# Patient Record
Sex: Female | Born: 1990
Health system: Western US, Academic
[De-identification: ages and names within clinical notes are randomized; demographics above are authoritative.]

---
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
## 2010-04-10 LAB — URINE CULTURE
Colony Count: NO GROWTH
Culture: NO GROWTH

## 2010-04-10 LAB — URINALYSIS, ROUTINE W REFLEX MICROSCOPIC
Glucose, UA: NEGATIVE mg/dL
Protein, ur: NEGATIVE mg/dL
Urobilinogen, UA: 0.2 mg/dL (ref 0.0–1.0)

## 2011-09-08 IMAGING — CR DG CHEST 2V
2 series · 2 of 2 positions shown · non-contrast
Comparison: None.

CLINICAL DATA: Fever and chills.

CHEST - 2 VIEW

[w chest pa]
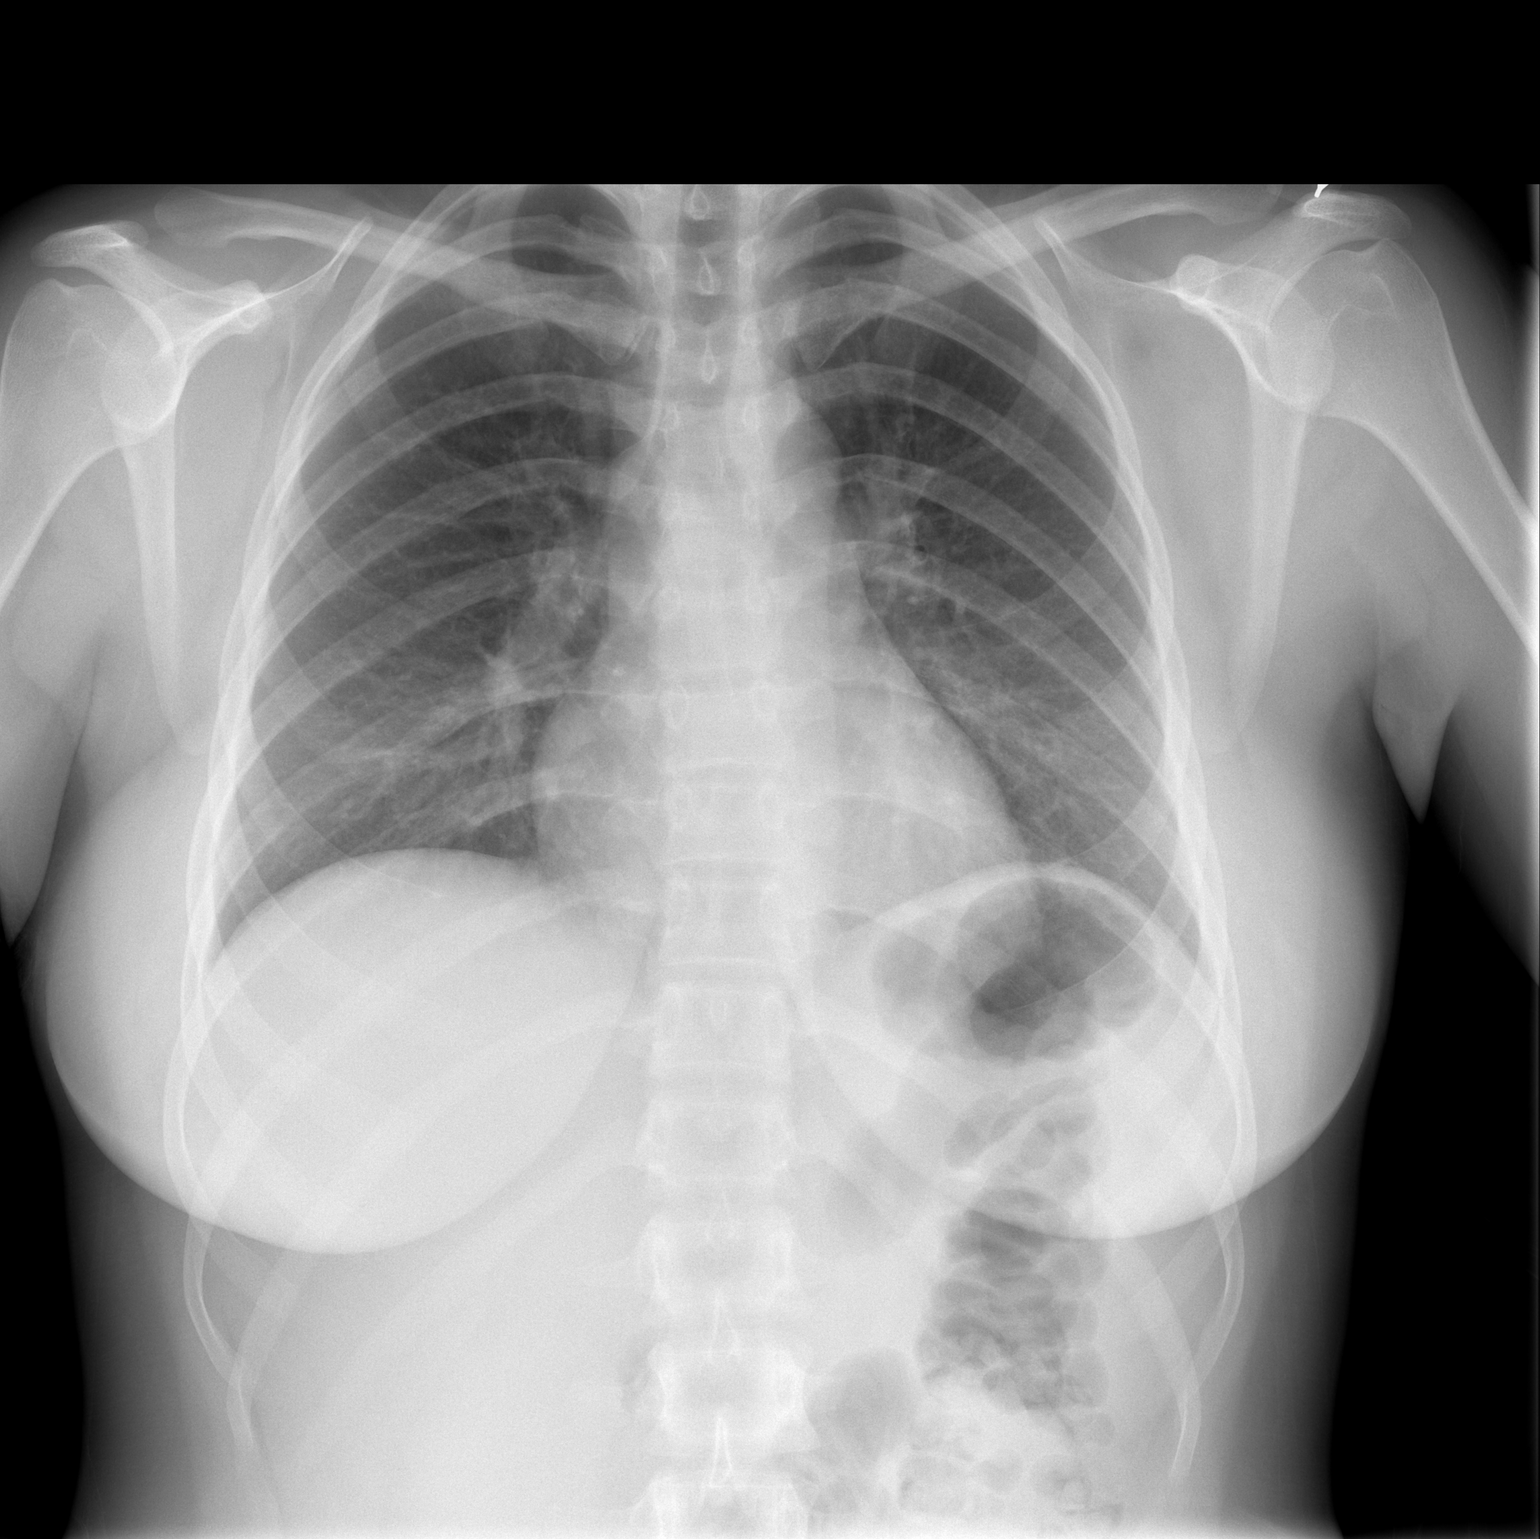

[w chest lat]
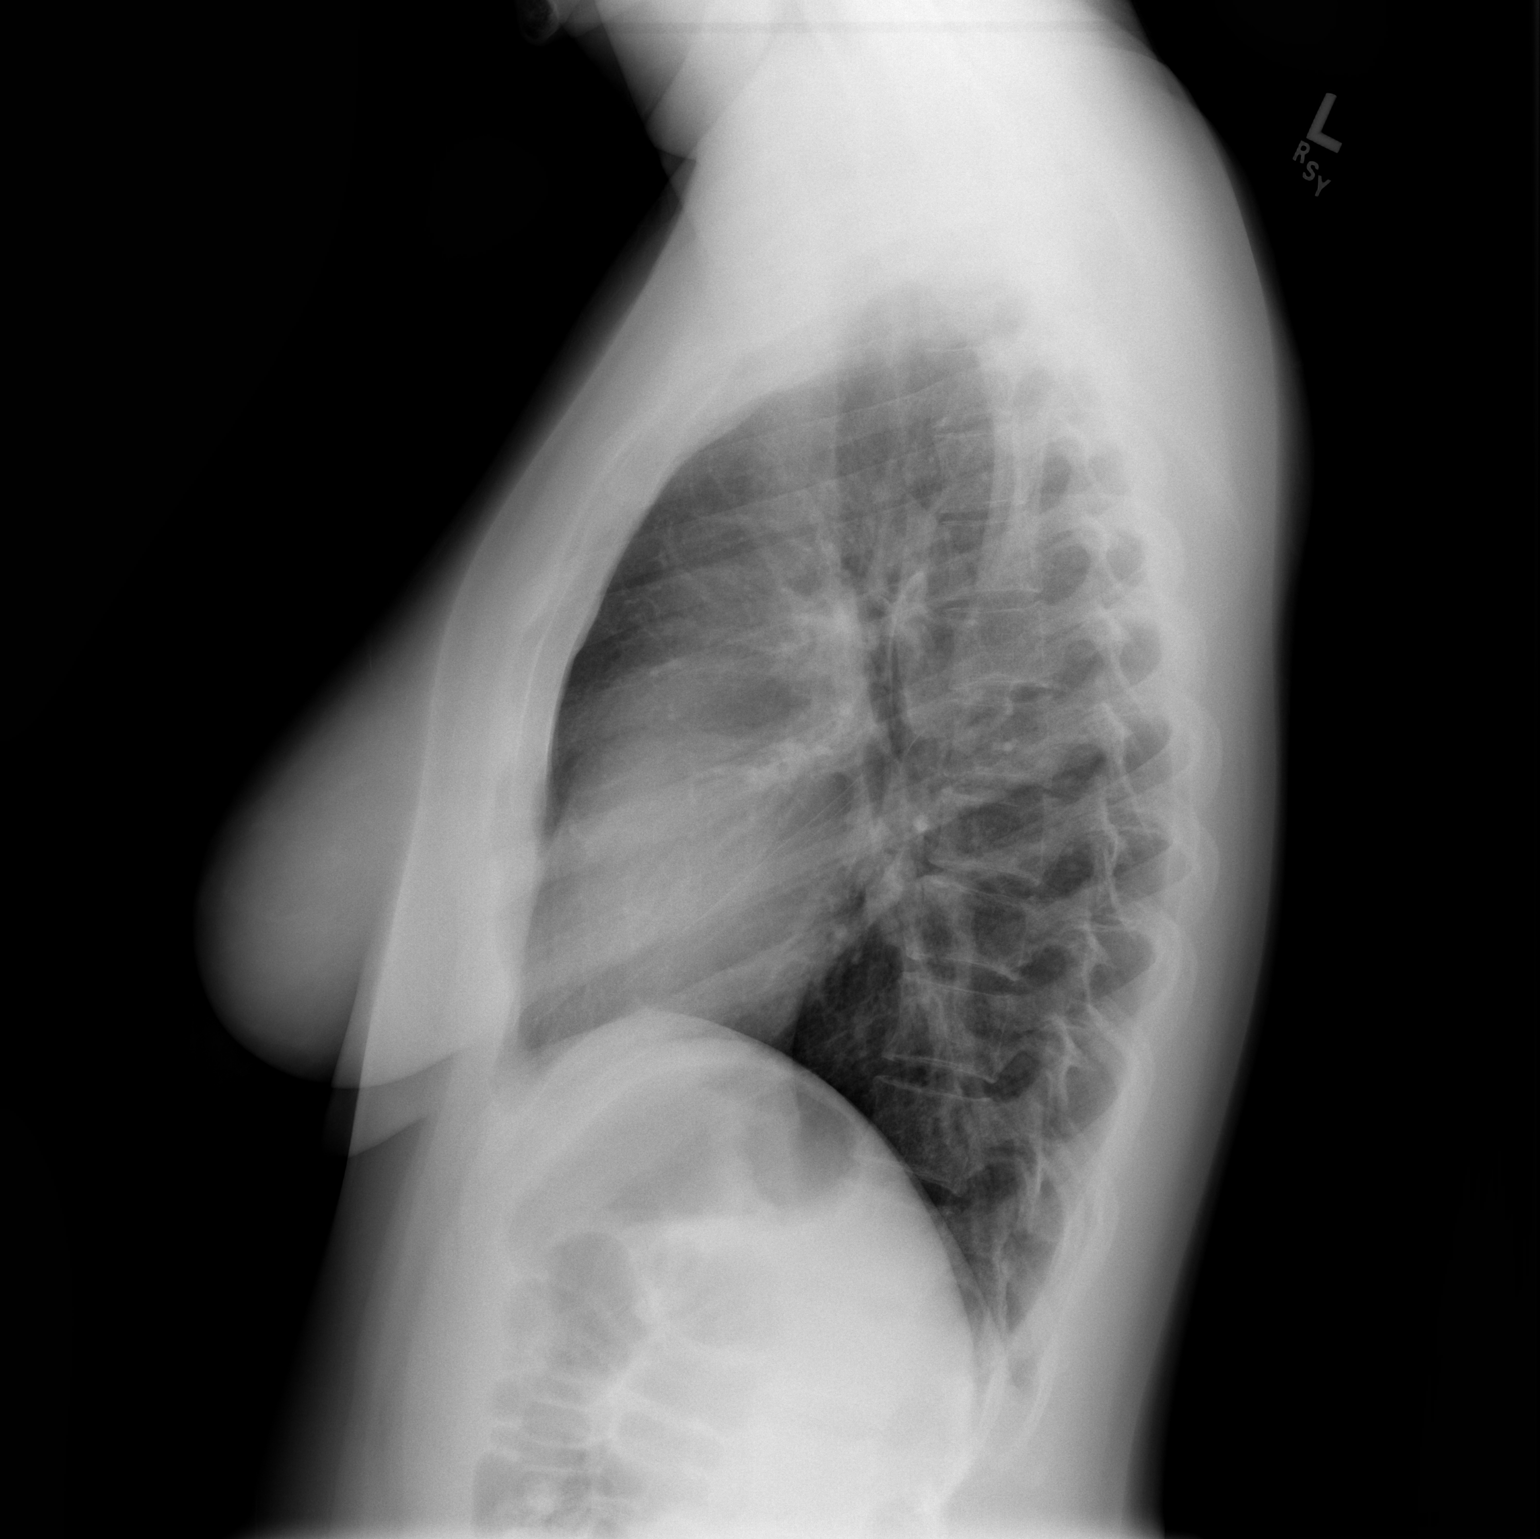

[2 of 2 positions shown; findings below may reference images not displayed]

FINDINGS: The lungs are clear.  No pleural effusion.  Heart size
normal.
IMPRESSION: Negative exam.

## 2017-08-20 ENCOUNTER — Ambulatory Visit

## 2017-08-21 ENCOUNTER — Ambulatory Visit

## 2017-08-21 DIAGNOSIS — Z01419 Encounter for gynecological examination (general) (routine) without abnormal findings: Secondary | ICD-10-CM

## 2017-08-21 NOTE — Progress Notes
[name]   OBSTETRICS, GYNECOLOGY & UROGYNECOLOGY     PATIENT: Erica Wilkins  MRN: [id number]  DOB: 10/14/1990  DATE OF SERVICE: 08/21/2017  PCP: Aviva Kluver, MD     Chief Complaint:   Chief Complaint   Patient presents with   ??? Annual Exam       Subjective:  Erica Wilkins is a 27 y.o. female [id number] due for annual well woman exam.    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 Losing insurance at end of this month.   No complaints.   Mirena IUD x 2 years. Some cramping, no bleeding. Overall happy.     Last Pap: 07/2015- normal. No hx of abnormal pap smears.     Past Ob/Gyn Hx  OB History     Gravida Para Term Preterm AB Living    0 0 0 0 0 0    SAB TAB Ectopic Multiple Live Births    0 0 0 0          Obstetric Comments    No hx of abnormal pap   No hx of STI/ HSV  Mirena IUD 2017          See above for gyn history    Past medical/surgical/family history updated.    Past Medical Hx  Past Medical History:   Diagnosis Date   ??? Patient denies medical problems        Past Surgical Hx  Past Surgical History:   Procedure Laterality Date   ??? right arm surgery   1998       Past Family Hx  Family History   Problem Relation Age of Onset   ??? Lymphoma Father    ??? Thyroid disease Sister    ??? Thyroid disease Sister    ??? Lung cancer Maternal Grandfather         smoker   ??? Lung cancer Paternal Grandfather         smoker   ??? Diabetes Paternal Grandfather    ??? Diabetes Paternal Grandmother    ??? Breast cancer Neg Hx    ??? Ovarian cancer Neg Hx    ??? Uterine cancer Neg Hx    ??? Colon cancer Neg Hx        Social Hx:  Social History     Social History   ??? Marital status: Single     Spouse name: N/A   ??? Number of children: N/A   ??? Years of education: N/A     Social History Main Topics   ??? Smoking status: Never Smoker   ??? Smokeless tobacco: Never Used   ??? Alcohol use Yes   ??? Drug use: No   ??? Sexual activity: Yes     Partners: Male Birth control/ protection: Female Condom     Other Topics Concern   ??? 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        Objective:  Vitals:    08/21/17 1349   BP: 106/66   Pulse: 69   Temp: 37.1 ???C (98.7 ???F)   TempSrc: Oral   Weight: 188 lb (85.3 kg)   Height: 5' 5'' (1.651 m)     Gen: No acute distress, well appearing  Breasts: symmetric bilaterally, no cervical or axillary LAD. No  nipple discharge. No skin changes. Soft, no discrete masses palpated. Fibrocystic changes diffusely noted.  Abd: Soft nontender, nondistended  Pelvic:  URETHRA: normal appearing urethra with no masses, tenderness or lesions  CLITORIS: normal appearing clitoris with no hypertrophy or enlargement  BARTHOLIN'S GLANDS: normal appearing Bartholin's glands with no masses, tenderness or erythema  VULVA: normal appearing vulva with no masses, tenderness or lesions  VAGINA: normal appearing vagina, estrogenized, no masses or no lesions  CERVIX: Normal appearing cervix without discharge or lesions. Non-tender. String visualized  UTERUS: uterus is normal size, shape, consistency and nontender  ADNEXA: No adnexal masses or tenderness to palpation  ANUS: no external hemorrhoids, fissures, or masses    Assessment and Plan:  1. Encounter for gynecological examination without abnormal finding    Normal breast and pelvic exam.   S/p gardisil vaccination series  Pap - not due until 07/2018  Mammogram -n/a  Mirena IUD 2017- reviewed FDA approved for 5 years, effective up to 7 years.   Counseling:  - Discussed breast self awareness, clinical breast exams, and screening mammograms starting at 27 years old every 1-2 years.   - Pap screening recommendations (low risk pts) reviewed: Pap Q 3 yrs in women age 30-29, Pap with HPV Q 5 yrs in women age 50-65, discontinue pap screening at age 72 - Calcium 1000-1300mg  /Vit D 600-800IU daily/weight bearing exercises recommended for bone health.  - Reviewed exercise and cardiovascular health recommendations  - RTC for annual WWE, sooner PRN      Electronically signed by:  Larey Seat, MD  [facility]

## 2019-02-11 ENCOUNTER — Ambulatory Visit: Payer: 59 | Admitting: Advanced Practice Midwife

## 2019-02-11 ENCOUNTER — Encounter: Payer: Self-pay | Admitting: Advanced Practice Midwife

## 2019-02-11 ENCOUNTER — Other Ambulatory Visit: Payer: Self-pay

## 2019-02-11 VITALS — BP 125/72 | HR 71 | Ht 65.0 in | Wt 195.7 lb

## 2019-02-11 DIAGNOSIS — Z01419 Encounter for gynecological examination (general) (routine) without abnormal findings: Secondary | ICD-10-CM | POA: Diagnosis not present

## 2019-02-11 DIAGNOSIS — Z8349 Family history of other endocrine, nutritional and metabolic diseases: Secondary | ICD-10-CM

## 2019-02-11 DIAGNOSIS — Z124 Encounter for screening for malignant neoplasm of cervix: Secondary | ICD-10-CM

## 2019-02-11 NOTE — Patient Instructions (Signed)
Exercising to Stay Healthy To become healthy and stay healthy, it is recommended that you do moderate-intensity and vigorous-intensity exercise. You can tell that you are exercising at a moderate intensity if your heart starts beating faster and you start breathing faster but can still hold a conversation. You can tell that you are exercising at a vigorous intensity if you are breathing much harder and faster and cannot hold a conversation while exercising. Exercising regularly is important. It has many health benefits, such as:  Improving overall fitness, flexibility, and endurance.  Increasing bone density.  Helping with weight control.  Decreasing body fat.  Increasing muscle strength.  Reducing stress and tension.  Improving overall health. How often should I exercise? Choose an activity that you enjoy, and set realistic goals. Your health care provider can help you make an activity plan that works for you. Exercise regularly as told by your health care provider. This may include:  Doing strength training two times a week, such as: ? Lifting weights. ? Using resistance bands. ? Push-ups. ? Sit-ups. ? Yoga.  Doing a certain intensity of exercise for a given amount of time. Choose from these options: ? A total of 150 minutes of moderate-intensity exercise every week. ? A total of 75 minutes of vigorous-intensity exercise every week. ? A mix of moderate-intensity and vigorous-intensity exercise every week. Children, pregnant women, people who have not exercised regularly, people who are overweight, and older adults may need to talk with a health care provider about what activities are safe to do. If you have a medical condition, be sure to talk with your health care provider before you start a new exercise program. What are some exercise ideas? Moderate-intensity exercise ideas include:  Walking 1 mile (1.6 km) in about 15  minutes.  Biking.  Hiking.  Golfing.  Dancing.  Water aerobics. Vigorous-intensity exercise ideas include:  Walking 4.5 miles (7.2 km) or more in about 1 hour.  Jogging or running 5 miles (8 km) in about 1 hour.  Biking 10 miles (16.1 km) or more in about 1 hour.  Lap swimming.  Roller-skating or in-line skating.  Cross-country skiing.  Vigorous competitive sports, such as football, basketball, and soccer.  Jumping rope.  Aerobic dancing. What are some everyday activities that can help me to get exercise?  Yard work, such as: ? Pushing a lawn mower. ? Raking and bagging leaves.  Washing your car.  Pushing a stroller.  Shoveling snow.  Gardening.  Washing windows or floors. How can I be more active in my day-to-day activities?  Use stairs instead of an elevator.  Take a walk during your lunch break.  If you drive, park your car farther away from your work or school.  If you take public transportation, get off one stop early and walk the rest of the way.  Stand up or walk around during all of your indoor phone calls.  Get up, stretch, and walk around every 30 minutes throughout the day.  Enjoy exercise with a friend. Support to continue exercising will help you keep a regular routine of activity. What guidelines can I follow while exercising?  Before you start a new exercise program, talk with your health care provider.  Do not exercise so much that you hurt yourself, feel dizzy, or get very short of breath.  Wear comfortable clothes and wear shoes with good support.  Drink plenty of water while you exercise to prevent dehydration or heat stroke.  Work out until your breathing   and your heartbeat get faster. Where to find more information  [place] Department of Health and Human Services: www.hhs.gov  Centers for Disease Control and Prevention (CDC): www.cdc.gov Summary  Exercising regularly is important. It will improve your overall fitness,  flexibility, and endurance.  Regular exercise also will improve your overall health. It can help you control your weight, reduce stress, and improve your bone density.  Do not exercise so much that you hurt yourself, feel dizzy, or get very short of breath.  Before you start a new exercise program, talk with your health care provider. This information is not intended to replace advice given to you by your health care provider. Make sure you discuss any questions you have with your health care provider. Document Revised: 12/22/2016 Document Reviewed: 11/30/2016 Elsevier Patient Education  2020 Elsevier Inc.  

## 2019-02-11 NOTE — Progress Notes (Signed)
Subjective:     Martha Maxwell is a 29 y.o. female here at [facility] for a routine exam.  Current complaints: None.  Pt reports strong family hx of thyroid disorders and would like to be checked.  She reports some weight gain this year related to COVID/quarantine and is working on that in the new year.  She recently changed insurance and is setting up a PCP soon.  Personal health questionnaire reviewed: yes.    Office Visit from 02/11/2019 in [facility]  PHQ-2 Total Score  0       Gynecologic History No LMP recorded. (Menstrual status: IUD). Contraception: IUD Last Pap: 2019. Results were: normal Last mammogram: n/a.  Obstetric History OB History  Gravida Para Term Preterm AB Living  0 0 0 0 0 0  SAB TAB Ectopic Multiple Live Births  0 0 0 0 0     The following portions of the patient's history were reviewed and updated as appropriate: allergies, current medications, past family history, past medical history, past social history, past surgical history and problem list.  Review of Systems Pertinent items noted in HPI and remainder of comprehensive ROS otherwise negative.    Objective:   BP 125/72   Pulse 71   Ht 5\' 5"  (1.651 m)   Wt 195 lb 11.2 oz (88.8 kg)   BMI 32.57 kg/m   VS reviewed, nursing note reviewed,  Constitutional: well developed, well nourished, no distress HEENT: normocephalic CV: normal rate Pulm/chest wall: normal effort Breast Exam:  right breast normal without mass, skin or nipple changes or axillary nodes, left breast normal without mass, skin or nipple changes or axillary nodes Abdomen: soft Neuro: alert and oriented x 3 Skin: warm, dry Psych: affect normal Pelvic exam: Cervix pink, visually closed, without lesion, scant white creamy discharge, vaginal walls and external genitalia normal Bimanual exam: Cervix 0/long/high, firm, anterior, neg CMT, uterus nontender, nonenlarged, adnexa without tenderness, enlargement, or  mass     Assessment/Plan:   1. Well woman exam with routine gynecological exam --Pt doing well, no gyn concerns. Is happy with IUD, no periods currently.  IUD due to come out 2022 and not sure what she wants to do at that time.  Hopes to be engaged to her s/o by then and thinking about planning a family.  --Pt to establish care with PCP --Continue healthy lifestyle changes this year  2. Screening for cervical cancer  - Cytology - PAP( [facility])  3. Family history of thyroid problem --Thyroid exam today wnl - TSH     Follow up in: 1 year or as needed.   2023, CNM 9:19 AM

## 2019-02-11 NOTE — Progress Notes (Signed)
Pt presents for annual and pap smear. IUD check - Mirena expires 07/2020 per pt  Pt requests thyroid testing d/t family hx.

## 2019-02-12 LAB — CYTOLOGY - PAP: Diagnosis: NEGATIVE
